# Patient Record
Sex: Male | Born: 1955 | Race: Black or African American | Hispanic: Refuse to answer | Marital: Single | Smoking: Current every day smoker
Health system: Southern US, Community
[De-identification: ages and names within clinical notes are randomized; demographics above are authoritative.]

## PROBLEM LIST (undated history)

## (undated) DIAGNOSIS — I219 Acute myocardial infarction, unspecified: Secondary | ICD-10-CM

## (undated) HISTORY — PX: OTHER SURGICAL HISTORY: SHX169

---

## 2015-08-03 ENCOUNTER — Emergency Department
Admission: EM | Admit: 2015-08-03 | Discharge: 2015-08-03 | Disposition: A | Discharge status: Home / Self Care | Payer: Self-pay | Attending: Emergency Medicine | Admitting: Emergency Medicine

## 2015-08-03 ENCOUNTER — Emergency Department: Payer: Self-pay

## 2015-08-03 ENCOUNTER — Encounter: Payer: Self-pay | Admitting: Emergency Medicine

## 2015-08-03 DIAGNOSIS — F10920 Alcohol use, unspecified with intoxication, uncomplicated: Secondary | ICD-10-CM

## 2015-08-03 DIAGNOSIS — F1110 Opioid abuse, uncomplicated: Secondary | ICD-10-CM | POA: Insufficient documentation

## 2015-08-03 DIAGNOSIS — I252 Old myocardial infarction: Secondary | ICD-10-CM | POA: Insufficient documentation

## 2015-08-03 DIAGNOSIS — F10120 Alcohol abuse with intoxication, uncomplicated: Secondary | ICD-10-CM | POA: Insufficient documentation

## 2015-08-03 DIAGNOSIS — Z720 Tobacco use: Secondary | ICD-10-CM | POA: Insufficient documentation

## 2015-08-03 DIAGNOSIS — F1210 Cannabis abuse, uncomplicated: Secondary | ICD-10-CM | POA: Insufficient documentation

## 2015-08-03 DIAGNOSIS — R079 Chest pain, unspecified: Secondary | ICD-10-CM | POA: Insufficient documentation

## 2015-08-03 HISTORY — DX: Acute myocardial infarction, unspecified: I21.9

## 2015-08-03 LAB — URINE DRUG SCREEN, QUALITATIVE (ARMC ONLY)
Amphetamines, Ur Screen: NOT DETECTED
BARBITURATES, UR SCREEN: NOT DETECTED
BENZODIAZEPINE, UR SCRN: NOT DETECTED
CANNABINOID 50 NG, UR ~~LOC~~: POSITIVE — AB
Cocaine Metabolite,Ur ~~LOC~~: NOT DETECTED
MDMA (Ecstasy)Ur Screen: NOT DETECTED
METHADONE SCREEN, URINE: NOT DETECTED
Opiate, Ur Screen: POSITIVE — AB
Phencyclidine (PCP) Ur S: NOT DETECTED
TRICYCLIC, UR SCREEN: NOT DETECTED

## 2015-08-03 LAB — COMPREHENSIVE METABOLIC PANEL
ALK PHOS: 68 U/L (ref 38–126)
ALT: 29 U/L (ref 17–63)
ANION GAP: 6 (ref 5–15)
AST: 28 U/L (ref 15–41)
Albumin: 4.5 g/dL (ref 3.5–5.0)
BUN: 14 mg/dL (ref 6–20)
CALCIUM: 9 mg/dL (ref 8.9–10.3)
CO2: 23 mmol/L (ref 22–32)
CREATININE: 0.89 mg/dL (ref 0.61–1.24)
Chloride: 108 mmol/L (ref 101–111)
Glucose, Bld: 91 mg/dL (ref 65–99)
Potassium: 3.9 mmol/L (ref 3.5–5.1)
SODIUM: 137 mmol/L (ref 135–145)
TOTAL PROTEIN: 7.8 g/dL (ref 6.5–8.1)
Total Bilirubin: 0.7 mg/dL (ref 0.3–1.2)

## 2015-08-03 LAB — CBC
HEMATOCRIT: 45.8 % (ref 40.0–52.0)
HEMOGLOBIN: 14.9 g/dL (ref 13.0–18.0)
MCH: 27.5 pg (ref 26.0–34.0)
MCHC: 32.5 g/dL (ref 32.0–36.0)
MCV: 84.7 fL (ref 80.0–100.0)
Platelets: 259 10*3/uL (ref 150–440)
RBC: 5.41 MIL/uL (ref 4.40–5.90)
RDW: 15 % — AB (ref 11.5–14.5)
WBC: 7.2 10*3/uL (ref 3.8–10.6)

## 2015-08-03 LAB — ETHANOL: ALCOHOL ETHYL (B): 83 mg/dL — AB (ref <5)

## 2015-08-03 LAB — TROPONIN I: Troponin I: <0.03 ng/mL (ref <0.031)

## 2015-08-03 MED ORDER — MORPHINE SULFATE (PF) 4 MG/ML IV SOLN
4.0000 mg | Freq: Once | INTRAVENOUS | Status: AC
  Administered 2015-08-03: 4 mg via INTRAVENOUS
  Filled 2015-08-03: qty 1

## 2015-08-03 MED ORDER — ASPIRIN 81 MG PO CHEW
324.0000 mg | CHEWABLE_TABLET | Freq: Once | ORAL | Status: AC
  Administered 2015-08-03: 324 mg via ORAL
  Filled 2015-08-03: qty 4

## 2015-08-03 MED ORDER — NITROGLYCERIN 0.4 MG SL SUBL
0.4000 mg | SUBLINGUAL_TABLET | Freq: Once | SUBLINGUAL | Status: AC
  Administered 2015-08-03: 0.4 mg via SUBLINGUAL
  Filled 2015-08-03: qty 1

## 2015-08-03 NOTE — ED Notes (Signed)
BIB EMS from the side of the road pt states he was walking to the hospital. Pt reports mid sternal chest pain that radiates to left arm and chest. Started last night then again this am. Given 1 dose of Nitro "SL. Pain unrelieved 9/10 endorses nausea and SOB.

## 2015-08-03 NOTE — ED Provider Notes (Signed)
[facility] Emergency Department Provider Note     Time seen: ----------------------------------------- 10:59 AM on 08/03/2015 -----------------------------------------    I have reviewed the triage vital signs and the nursing notes.   HISTORY  Chief Complaint Chest Pain    HPI Roger Rogers is a 59 y.o. male who presents ER and he was found walking on the side of the road. Patient states he was walking to the hospital, reports midsternal sharp chest pain that radiates to his left arm. States it started last night and again this morning. Pain was not really relieved with sublingual nitroglycerin spray. Pain was 910 on arrival, did have nausea and shortness of breath. Patient states he had this happen earlier this year was diagnosed with an MI at [facility]   Past Medical History  Diagnosis Date  . Acute MI     times    There are no active problems to display for this patient.   Past Surgical History  Procedure Laterality Date  . Left ear drum removed      Allergies Review of patient's allergies indicates no known allergies.  Social History Social History  Substance Use Topics  . Smoking status: Current Every Day Smoker -- 2.00 packs/day    Types: Cigarettes  . Smokeless tobacco: Never Used  . Alcohol Use: Yes     Comment: occassinally     Review of Systems Constitutional: Negative for fever. Eyes: Negative for visual changes. ENT: Negative for sore throat. Cardiovascular: Positive for chest pain Respiratory: Positive for shortness of breath Gastrointestinal: Negative for abdominal pain, positive for nausea Genitourinary: Negative for dysuria. Musculoskeletal: Negative for back pain. Skin: Negative for rash. Neurological: Negative for headaches, focal weakness or numbness.  10-point ROS otherwise negative.  ____________________________________________   PHYSICAL EXAM:  VITAL SIGNS: ED Triage Vitals  Enc Vitals Group      BP 08/03/15 1049 129/86 mmHg     Pulse Rate 08/03/15 1049 98     Resp 08/03/15 1049 18     Temp 08/03/15 1049 98.5 F (36.9 C)     Temp Source 08/03/15 1049 Oral     SpO2 08/03/15 1049 96 %     Weight 08/03/15 1049 182 lb (82.555 kg)     Height 08/03/15 1049  (1.676 m)     Head Cir --      Peak Flow --      Pain Score 08/03/15 1051 9     Pain Loc --      Pain Edu? --      Excl. in GC? --     Constitutional: Alert and oriented. Well appearing and in no distress. Eyes: Conjunctivae are normal. PERRL. Normal extraocular movements. ENT   Head: Normocephalic and atraumatic.   Nose: No congestion/rhinnorhea.   Mouth/Throat: Mucous membranes are moist.   Neck: No stridor. Cardiovascular: Normal rate, regular rhythm. Normal and symmetric distal pulses are present in all extremities. No murmurs, rubs, or gallops. Respiratory: Normal respiratory effort without tachypnea nor retractions. Breath sounds are clear and equal bilaterally. No wheezes/rales/rhonchi. Gastrointestinal: Soft and nontender. No distention. No abdominal bruits.  Musculoskeletal: Nontender with normal range of motion in all extremities. No joint effusions.  No lower extremity tenderness nor edema. Neurologic:  Normal speech and language. No gross focal neurologic deficits are appreciated. Speech is normal. No gait instability. Skin:  Skin is warm, dry and intact. No rash noted. Psychiatric: Mood and affect are normal. Speech and behavior are normal. Patient exhibits  appropriate insight and judgment. ____________________________________________  EKG: Interpreted by me. Sinus tachycardia with a rate of 101 bpm, normal PR interval, normal QS with, normal QT interval.  ____________________________________________  ED COURSE:  Pertinent labs & imaging results that were available during my care of the patient were reviewed by me and considered in my medical decision making (see chart for  details). Patient without specific chest pain, will give aspirin and nitroglycerin. I'll review his notes from [facility]. ____________________________________________    LABS (pertinent positives/negatives)  Labs Reviewed  CBC - Abnormal; Notable for the following:    RDW 15.0 (*)    All other components within normal limits  ETHANOL - Abnormal; Notable for the following:    Alcohol, Ethyl (B) 83 (*)    All other components within normal limits  URINE DRUG SCREEN, QUALITATIVE ([facility] ONLY) - Abnormal; Notable for the following:    Opiate, Ur Screen POSITIVE (*)    Cannabinoid 50 Ng, Ur [place] POSITIVE (*)    All other components within normal limits  TROPONIN I  COMPREHENSIVE METABOLIC PANEL    RADIOLOGY Images were viewed by me  Chest x-ray Is unremarkable ____________________________________________  FINAL ASSESSMENT AND PLAN  Chest pain  Plan: Patient with labs and imaging as dictated above. Patient is in no acute distress, likely combination of alcohol and other etiology. Stress test this year is negative. He is encouraged to close follow-up with his doctor   Emily Filbert, MD   Emily Filbert, MD 08/03/15 [id number]

## 2015-08-03 NOTE — Discharge Instructions (Signed)
°Alcohol Intoxication °Alcohol intoxication occurs when the amount of alcohol that a person has consumed impairs his or her ability to mentally and physically function. Alcohol directly impairs the normal chemical activity of the brain. Drinking large amounts of alcohol can lead to changes in mental function and behavior, and it can cause many physical effects that can be harmful.  °Alcohol intoxication can range in severity from mild to very severe. Various factors can affect the level of intoxication that occurs, such as the person's age, gender, weight, frequency of alcohol consumption, and the presence of other medical conditions (such as diabetes, seizures, or heart conditions). Dangerous levels of alcohol intoxication may occur when people drink large amounts of alcohol in a short period (binge drinking). Alcohol can also be especially dangerous when combined with certain prescription medicines or "recreational" drugs. °SIGNS AND SYMPTOMS °Some common signs and symptoms of mild alcohol intoxication include: °· Loss of coordination. °· Changes in mood and behavior. °· Impaired judgment. °· Slurred speech. °As alcohol intoxication progresses to more severe levels, other signs and symptoms will appear. These may include: °· Vomiting. °· Confusion and impaired memory. °· Slowed breathing. °· Seizures. °· Loss of consciousness. °DIAGNOSIS  °Your health care provider will take a medical history and perform a physical exam. You will be asked about the amount and type of alcohol you have consumed. Blood tests will be done to measure the concentration of alcohol in your blood. In many places, your blood alcohol level must be lower than 80 mg/dL (0.08%) to legally drive. However, many dangerous effects of alcohol can occur at much lower levels.  °TREATMENT  °People with alcohol intoxication often do not require treatment. Most of the effects of alcohol intoxication are temporary, and they go away as the alcohol  naturally leaves the body. Your health care provider will monitor your condition until you are stable enough to go home. Fluids are sometimes given through an IV access tube to help prevent dehydration.  °HOME CARE INSTRUCTIONS °· Do not drive after drinking alcohol. °· Stay hydrated. Drink enough water and fluids to keep your urine clear or pale yellow. Avoid caffeine.   °· Only take over-the-counter or prescription medicines as directed by your health care provider.   °SEEK MEDICAL CARE IF:  °· You have persistent vomiting.   °· You do not feel better after a few days. °· You have frequent alcohol intoxication. Your health care provider can help determine if you should see a substance use treatment counselor. °SEEK IMMEDIATE MEDICAL CARE IF:  °· You become shaky or tremble when you try to stop drinking.   °· You shake uncontrollably (seizure).   °· You throw up (vomit) blood. This may be bright red or may look like black coffee grounds.   °· You have blood in your stool. This may be bright red or may appear as a black, tarry, bad smelling stool.   °· You become lightheaded or faint.   °MAKE SURE YOU:  °· Understand these instructions. °· Will watch your condition. °· Will get help right away if you are not doing well or get worse. °Document Released: 08/12/2005 Document Revised: 07/05/2013 Document Reviewed: 04/07/2013 °[employer]® Patient Information ©2015 [employer]. This information is not intended to replace advice given to you by your health care provider. Make sure you discuss any questions you have with your health care provider. °Chest Pain (Nonspecific) °It is often hard to give a specific diagnosis for the cause of chest pain. There is always a chance that your pain could   be related to something serious, such as a heart attack or a blood clot in the lungs. You need to follow up with your health care provider for further evaluation. °CAUSES  °· Heartburn. °· Pneumonia or bronchitis. °· Anxiety or  stress. °· Inflammation around your heart (pericarditis) or lung (pleuritis or pleurisy). °· A blood clot in the lung. °· A collapsed lung (pneumothorax). It can develop suddenly on its own (spontaneous pneumothorax) or from trauma to the chest. °· Shingles infection (herpes zoster virus). °The chest wall is composed of bones, muscles, and cartilage. Any of these can be the source of the pain. °· The bones can be bruised by injury. °· The muscles or cartilage can be strained by coughing or overwork. °· The cartilage can be affected by inflammation and become sore (costochondritis). °DIAGNOSIS  °Lab tests or other studies may be needed to find the cause of your pain. Your health care provider may have you take a test called an ambulatory electrocardiogram (ECG). An ECG records your heartbeat patterns over a 24-hour period. You may also have other tests, such as: °· Transthoracic echocardiogram (TTE). During echocardiography, sound waves are used to evaluate how blood flows through your heart. °· Transesophageal echocardiogram (TEE). °· Cardiac monitoring. This allows your health care provider to monitor your heart rate and rhythm in real time. °· Holter monitor. This is a portable device that records your heartbeat and can help diagnose heart arrhythmias. It allows your health care provider to track your heart activity for several days, if needed. °· Stress tests by exercise or by giving medicine that makes the heart beat faster. °TREATMENT  °· Treatment depends on what may be causing your chest pain. Treatment may include: °· Acid blockers for heartburn. °· Anti-inflammatory medicine. °· Pain medicine for inflammatory conditions. °· Antibiotics if an infection is present. °· You may be advised to change lifestyle habits. This includes stopping smoking and avoiding alcohol, caffeine, and chocolate. °· You may be advised to keep your head raised (elevated) when sleeping. This reduces the chance of acid going backward  from your stomach into your esophagus. °Most of the time, nonspecific chest pain will improve within 2-3 days with rest and mild pain medicine.  °HOME CARE INSTRUCTIONS  °· If antibiotics were prescribed, take them as directed. Finish them even if you start to feel better. °· For the next few days, avoid physical activities that bring on chest pain. Continue physical activities as directed. °· Do not use any tobacco products, including cigarettes, chewing tobacco, or electronic cigarettes. °· Avoid drinking alcohol. °· Only take medicine as directed by your health care provider. °· Follow your health care provider's suggestions for further testing if your chest pain does not go away. °· Keep any follow-up appointments you made. If you do not go to an appointment, you could develop lasting (chronic) problems with pain. If there is any problem keeping an appointment, call to reschedule. °SEEK MEDICAL CARE IF:  °· Your chest pain does not go away, even after treatment. °· You have a rash with blisters on your chest. °· You have a fever. °SEEK IMMEDIATE MEDICAL CARE IF:  °· You have increased chest pain or pain that spreads to your arm, neck, jaw, back, or abdomen. °· You have shortness of breath. °· You have an increasing cough, or you cough up blood. °· You have severe back or abdominal pain. °· You feel nauseous or vomit. °· You have severe weakness. °· You faint. °· You   have chills. °This is an emergency. Do not wait to see if the pain will go away. Get medical help at once. Call your local emergency services (911 in [place] Do not drive yourself to the hospital. °MAKE SURE YOU:  °· Understand these instructions. °· Will watch your condition. °· Will get help right away if you are not doing well or get worse. °Document Released: 08/12/2005 Document Revised: 11/07/2013 Document Reviewed: 06/07/2008 °[employer]® Patient Information ©2015 [employer]. This information is not intended to replace advice given to you by  your health care provider. Make sure you discuss any questions you have with your health care provider. ° °

## 2016-07-31 IMAGING — CR DG CHEST 2V
2 series · 2 of 2 positions shown · non-contrast
Comparison: None.

CLINICAL DATA: Central to left-sided chest pain began this morning
with shortness of breath and nausea

EXAM:
CHEST  2 VIEW

[chest pa]
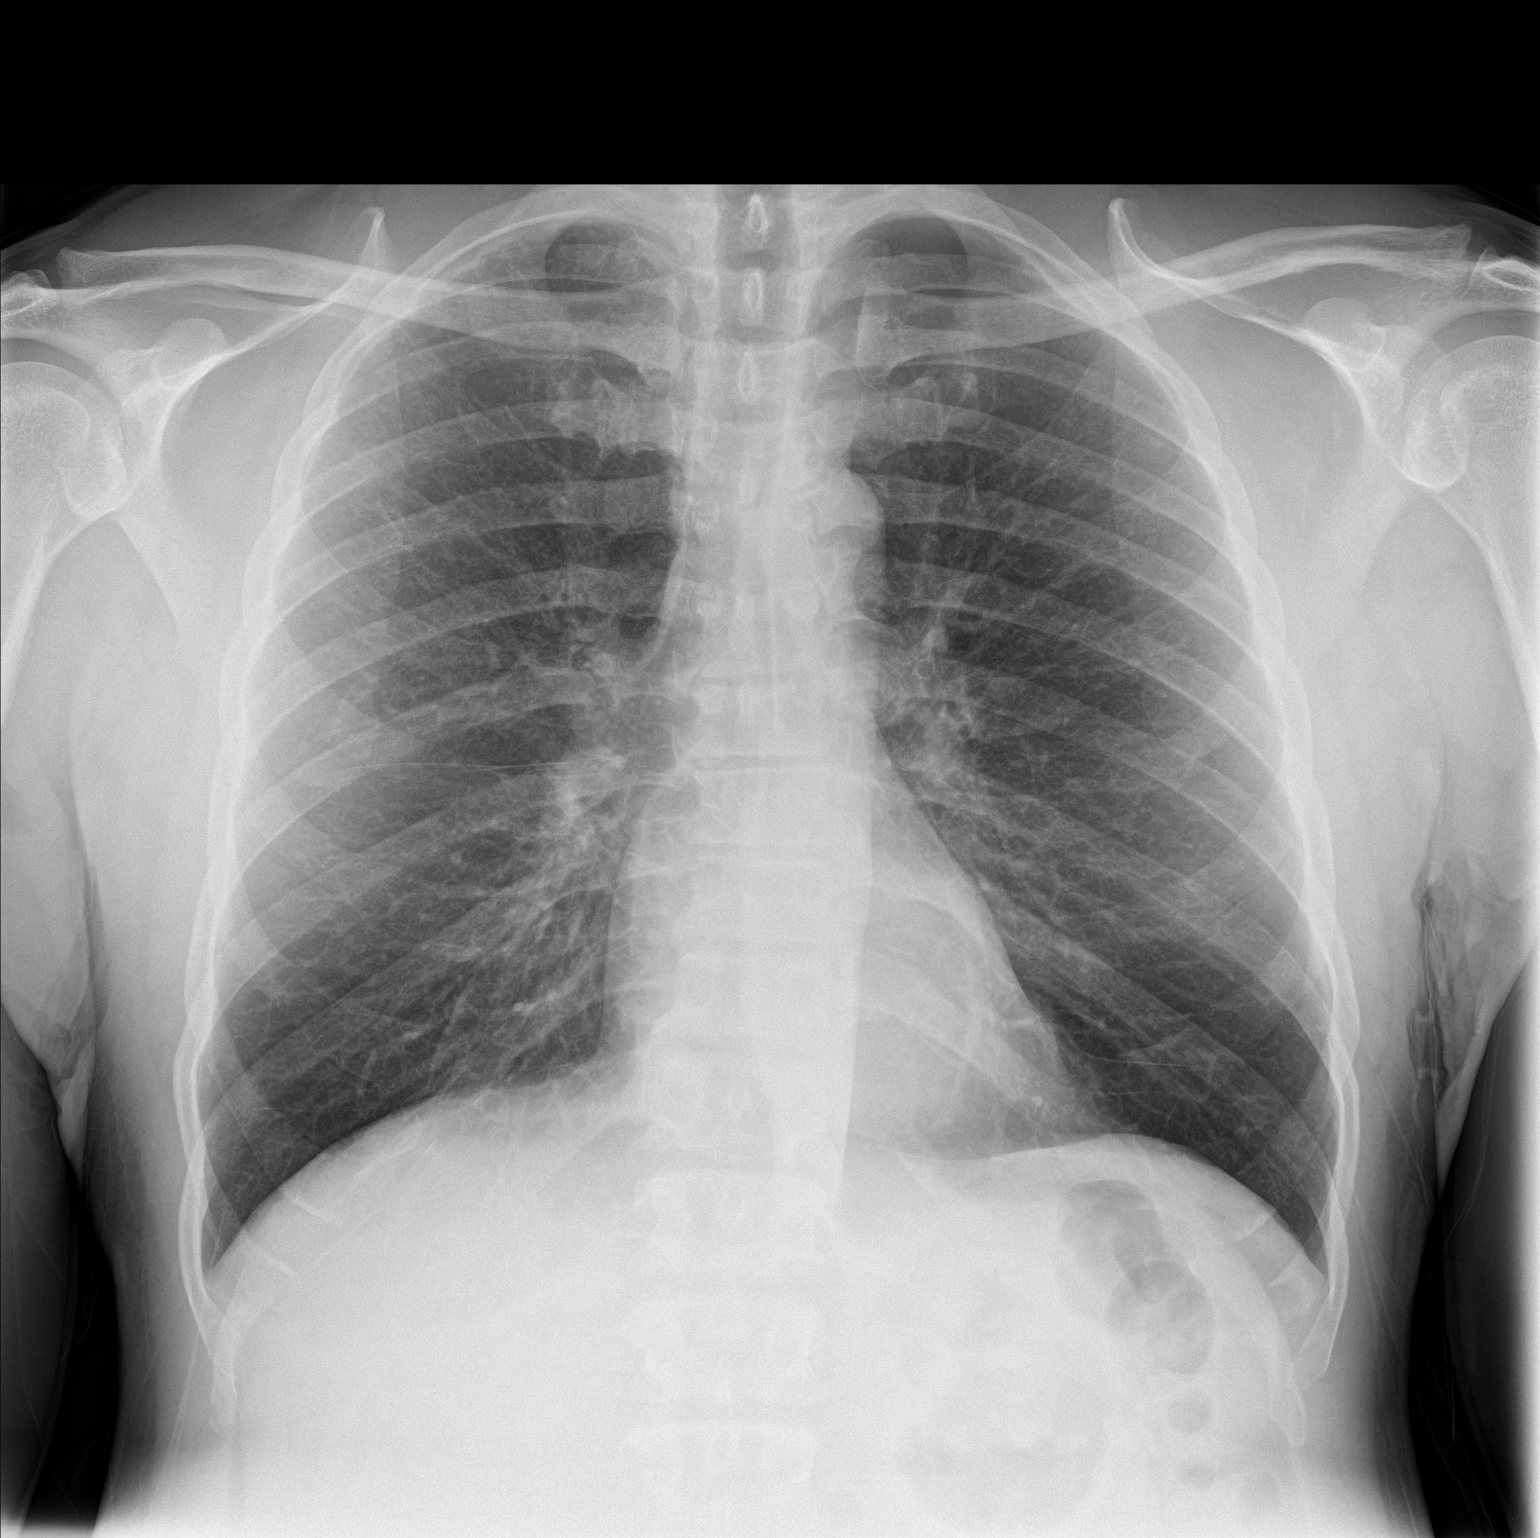

[chest lat]
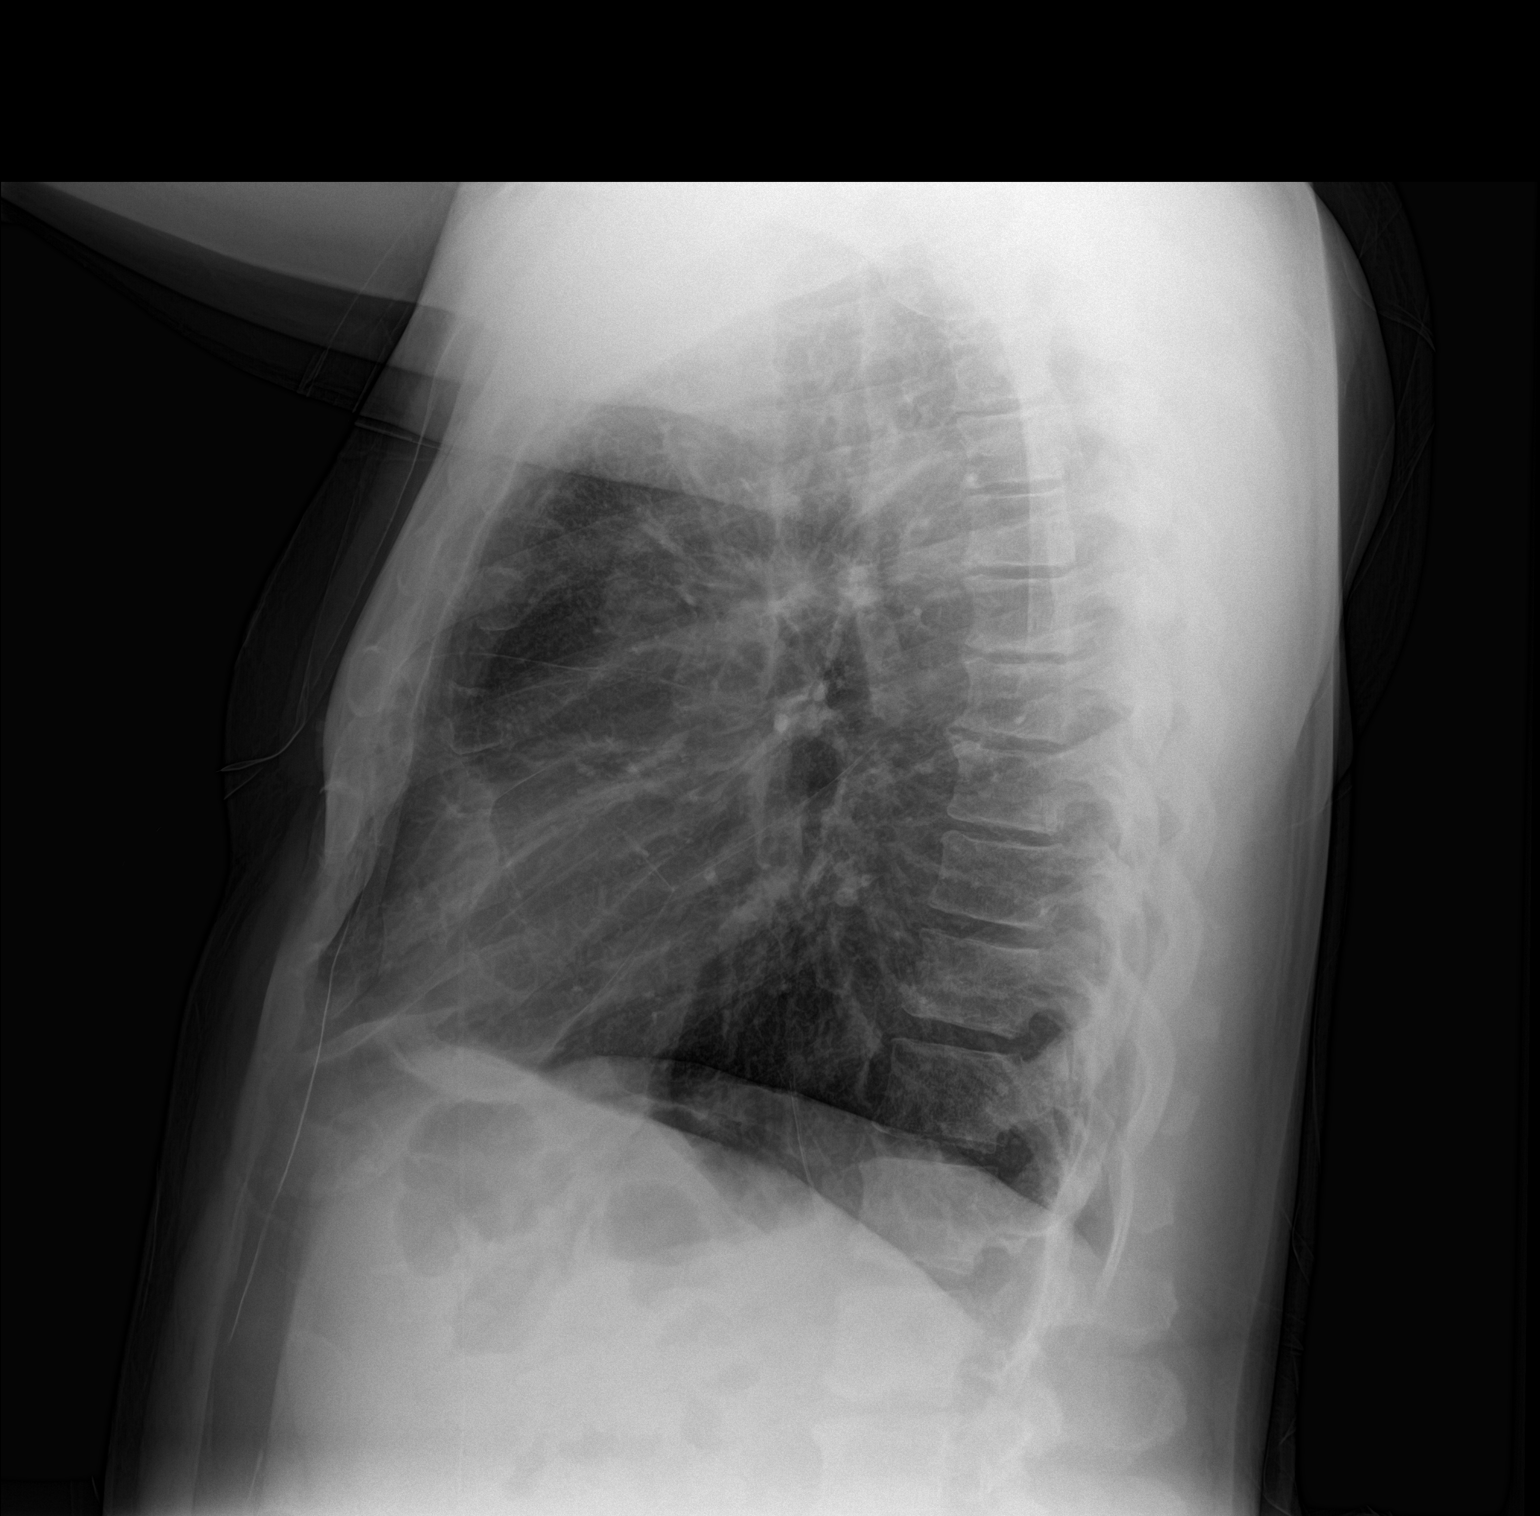

[2 of 2 positions shown; findings below may reference images not displayed]

FINDINGS: The heart size and mediastinal contours are within normal limits.
Both lungs are clear. The visualized skeletal structures are
unremarkable.
IMPRESSION: No active cardiopulmonary disease.
